# Patient Record
Sex: Male | Born: 2000
Health system: Southern US, Community
[De-identification: ages and names within clinical notes are randomized; demographics above are authoritative.]

## PROBLEM LIST (undated history)

## (undated) DIAGNOSIS — H5789 Other specified disorders of eye and adnexa: Secondary | ICD-10-CM

## (undated) HISTORY — DX: Other specified disorders of eye and adnexa: H57.89

---
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
## 2022-01-17 ENCOUNTER — Ambulatory Visit (INDEPENDENT_AMBULATORY_CARE_PROVIDER_SITE_OTHER): Payer: Medicaid Other | Admitting: Family Medicine

## 2022-01-17 ENCOUNTER — Encounter: Payer: Self-pay | Admitting: Family Medicine

## 2022-01-17 VITALS — BP 120/75 | HR 82 | Ht 68.9 in | Wt 195.2 lb

## 2022-01-17 DIAGNOSIS — R635 Abnormal weight gain: Secondary | ICD-10-CM

## 2022-01-17 DIAGNOSIS — R400 Somnolence: Secondary | ICD-10-CM | POA: Diagnosis not present

## 2022-01-17 DIAGNOSIS — T50905A Adverse effect of unspecified drugs, medicaments and biological substances, initial encounter: Secondary | ICD-10-CM | POA: Insufficient documentation

## 2022-01-17 DIAGNOSIS — F3164 Bipolar disorder, current episode mixed, severe, with psychotic features: Secondary | ICD-10-CM

## 2022-01-17 DIAGNOSIS — F312 Bipolar disorder, current episode manic severe with psychotic features: Secondary | ICD-10-CM

## 2022-01-17 NOTE — Assessment & Plan Note (Addendum)
I definitely think the weight gain is related to the olanzapine.  Recommend more regular exercise.  Talked with mom via phone, Damine and dad who are here in the office.  He had a lot of questions about diet.  I recommend just to healthy diet with no specific restriction on carbohydrates, no specific supplements etc.  I do not think this is related to any blood disorder.  He had extensive blood work in December and in January no sign of anemia or thyroid disorder.  There is no clinical exam signs that make me concerned for that either.  He does like to occasionally take trazodone at night which may potentially cause some weight gain but I think the bigger player is the olanzapine. ?

## 2022-01-17 NOTE — Patient Instructions (Signed)
I would recommend discussing your meds with psychiatry. I think the sleepiness and lack of energy and weight gain are partly related to his medicine. ?

## 2022-01-17 NOTE — Assessment & Plan Note (Signed)
Somnolence is most likely related to medication olanzapine.  It sounds like he has decreased significantly in his dose and the family and he were both hoping that the daytime sleepiness would go away.  Family was concerned something else such as anemia might be going on for thyroid issues.  Long discussion with Hayden [name] and with dad, I suspect olanzapine is still causing some somnolence.  I urged him to discuss with psychiatry.  Evidently they are going to try to taper him back to 5 mg.  I did warn him that even at 5 mg the side effects might not totally go away.  I recommended more regular exercise. ?

## 2022-01-17 NOTE — Progress Notes (Signed)
? ? ?  CHIEF COMPLAINT / HPI: ?Here with dad brother and his mom is on the phone.  Concerned about needing to sleep more than he or his family thinks he should.  Mom reports that he is always been one of the kids that needed more sleep than others.  Since he started the olanzapine, he has been very somnolent.  As they have decreased the dose, its gotten better.  Sometimes she has difficulty at night going to sleep and he takes trazodone which is prescribed by psychiatry.  He will need a least a nap or 2 during the day.  He does not feel very energetic and wants to do very little.  He does feel more settled in his mind than before starting the medication and his dad agrees with this saying he is "more lucid".  He has gained some weight but mom does not think he is following a very good diet.  Not getting regular exercise. ? ? ?PERTINENT  PMH / PSH: I have reviewed the patient?s medications, allergies, past medical and surgical history, smoking status and updated in the EMR as appropriate. ? ? ?OBJECTIVE: ? BP 120/75   Pulse 82   Ht 5' 8.9" (1.75 m)   Wt 195 lb 3.2 oz (88.5 kg)   SpO2 99%   BMI 28.91 kg/m?  ? ?GENERAL: Well-developed male no acute distress ?CV Pulses normal. ?MSK: Movement of extremity x4.  Can rise from a chair easily.  Normal gait. ?neuro: No gross focal neurologic logical deficits are noted. ?PSYCH: AxOx4. Good eye contact.. No psychomotor retardation or agitation. Appropriate speech fluency and content. Asks and answers questions appropriately. Mood is congruent. ? ? ?ASSESSMENT / PLAN: ? ? ?Somnolence ?Somnolence is most likely related to medication olanzapine.  It sounds like he has decreased significantly in his dose and the family and he were both hoping that the daytime sleepiness would go away.  Family was concerned something else such as anemia might be going on for thyroid issues.  Long discussion with Carmichael and with dad, I suspect olanzapine is still causing some somnolence.  I urged  him to discuss with psychiatry.  Evidently they are going to try to taper him back to 5 mg.  I did warn him that even at 5 mg the side effects might not totally go away.  I recommended more regular exercise. ? ?Weight gain ?I definitely think the weight gain is related to the olanzapine.  Recommend more regular exercise.  Talked with mom via phone, Thayden and dad who are here in the office.  He had a lot of questions about diet.  I recommend just to healthy diet with no specific restriction on carbohydrates, no specific supplements etc.  I do not think this is related to any blood disorder.  He had extensive blood work in December and in January no sign of anemia or thyroid disorder.  There is no clinical exam signs that make me concerned for that either.  He does like to occasionally take trazodone at night which may potentially cause some weight gain but I think the bigger player is the olanzapine. ? ?Bipolar I, most recent episode mixed, severe with psychotic behavior (HCC) ?He seems to be doing much better.  They have successfully tapered him down from higher dose Zyprexa/olanzapine.  I discontinued the Depakote and the propranolol.  Continue follow-up with psychiatry. ?  ?Denny Levy MD ?

## 2022-01-17 NOTE — Assessment & Plan Note (Signed)
He seems to be doing much better.  They have successfully tapered him down from higher dose Zyprexa/olanzapine.  I discontinued the Depakote and the propranolol.  Continue follow-up with psychiatry. ?

## 2022-02-13 ENCOUNTER — Encounter: Payer: Self-pay | Admitting: *Deleted

## 2023-06-27 IMAGING — CT CT HEAD W/O CM
3 series · 16 of 47 positions shown, 19 images · non-contrast
Comparison: None.

CLINICAL DATA: Altered mental status

EXAM:
CT HEAD WITHOUT CONTRAST
TECHNIQUE: Contiguous axial images were obtained from the base of the skull
through the vertex without intravenous contrast.

[Series 2: head wo · axial · 0.47mm/px · z∈[+1380,+1530]mm · 10 of 36 slices shown, 13 images]
[im 3/36  brain]
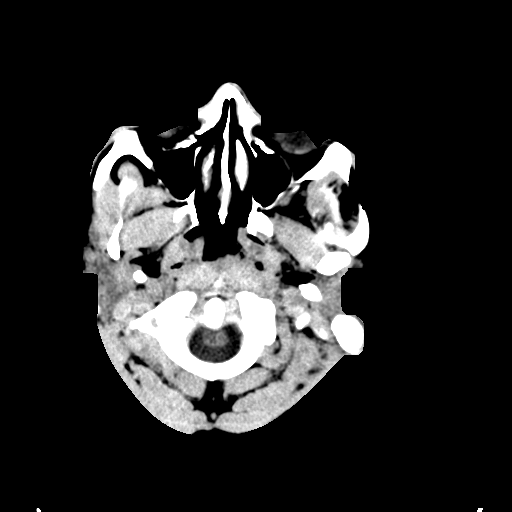
[im 3/36  bone]
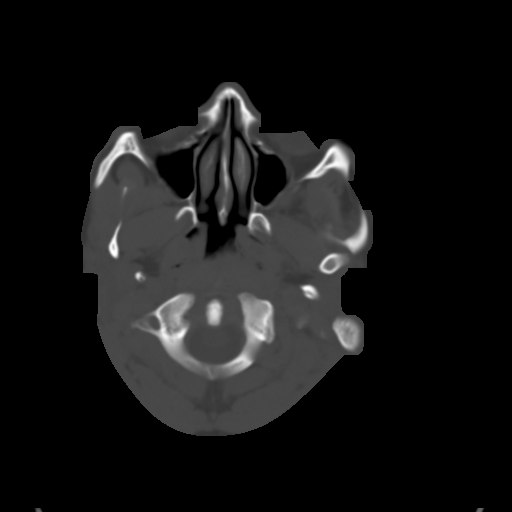
[im 7/36  brain]
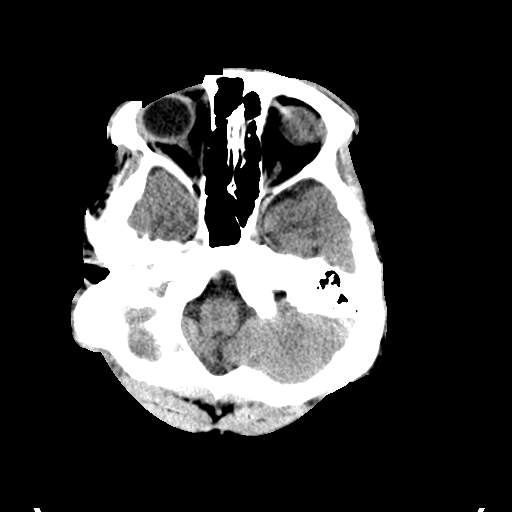
[im 10/36  brain]
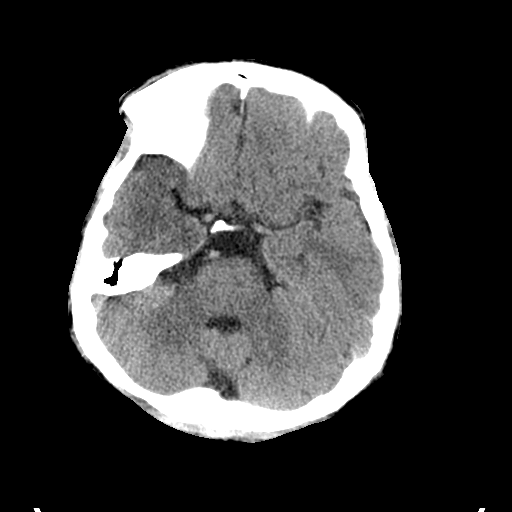
[im 13/36  brain]
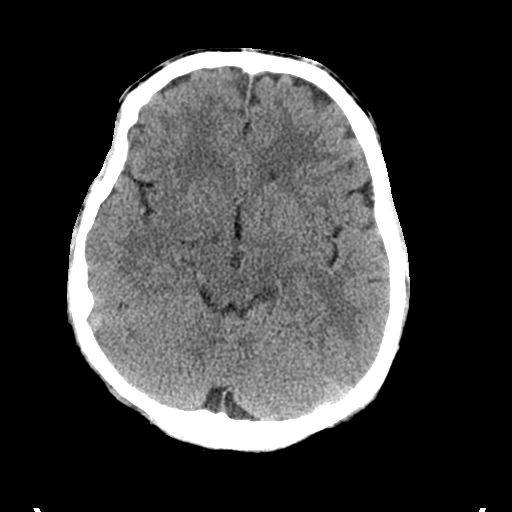
[im 16/36  brain]
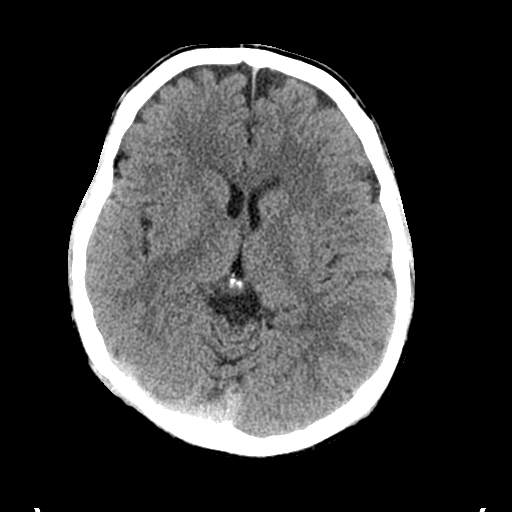
[im 16/36  bone]
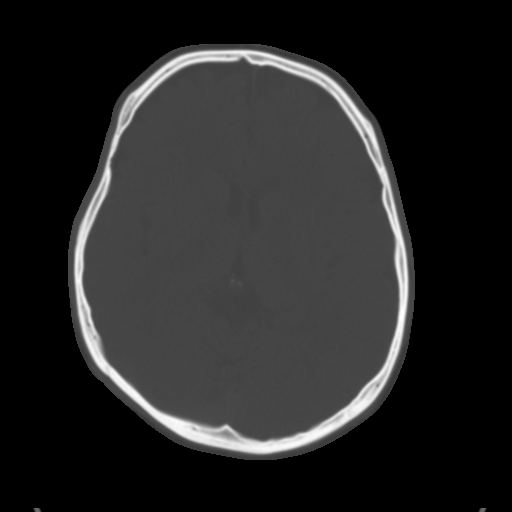
[im 20/36  brain]
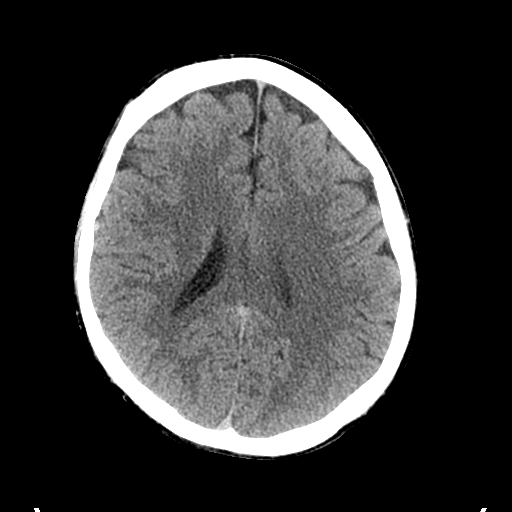
[im 23/36  brain]
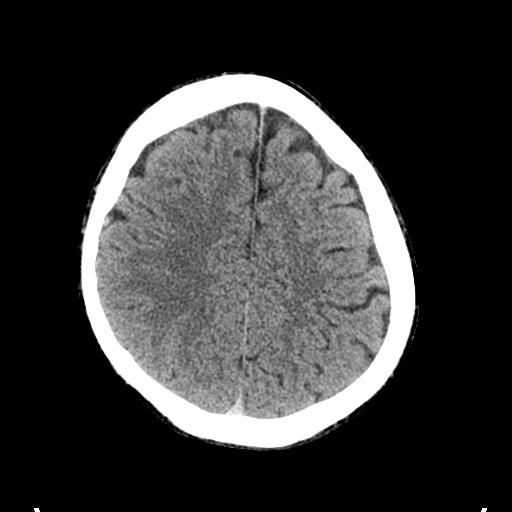
[im 27/36  brain]
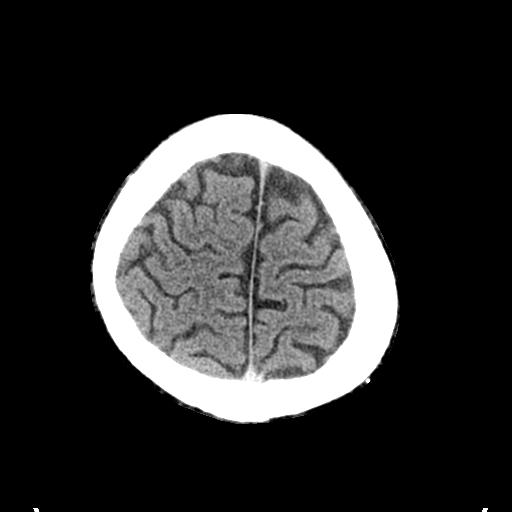
[im 29/36  brain]
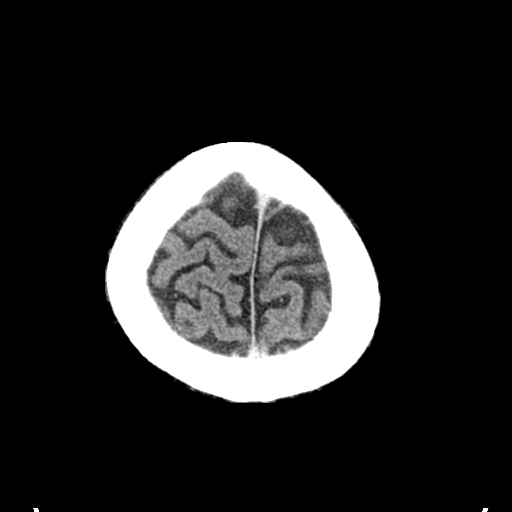
[im 29/36  bone]
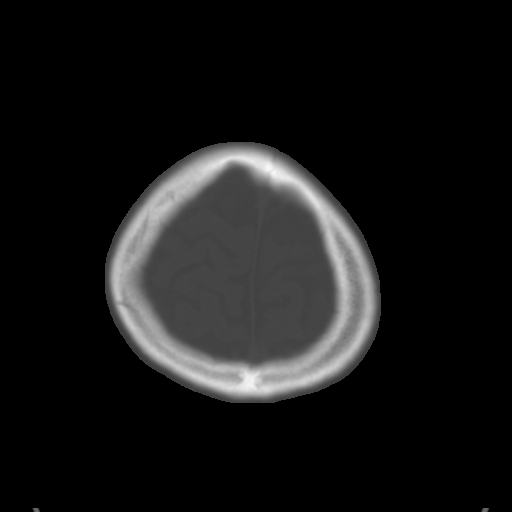
[im 33/36  brain]
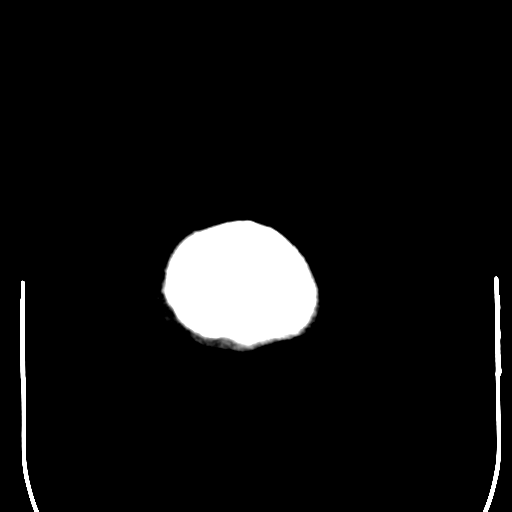

[Series 4: coronal soft tissue · coronal · 0.37mm/px · 3 of 73 slices shown]
[im 25/73  brain]
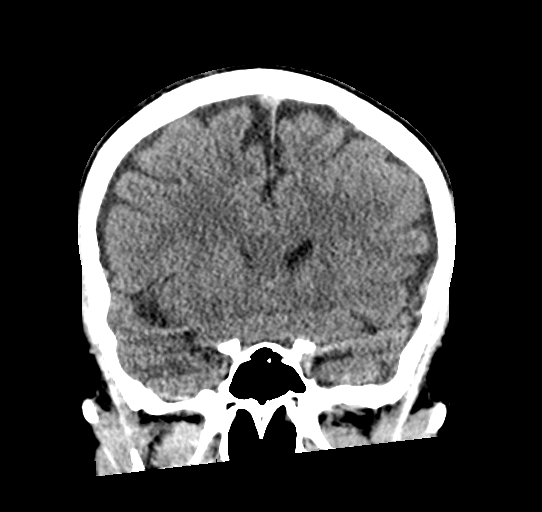
[im 33/73  brain]
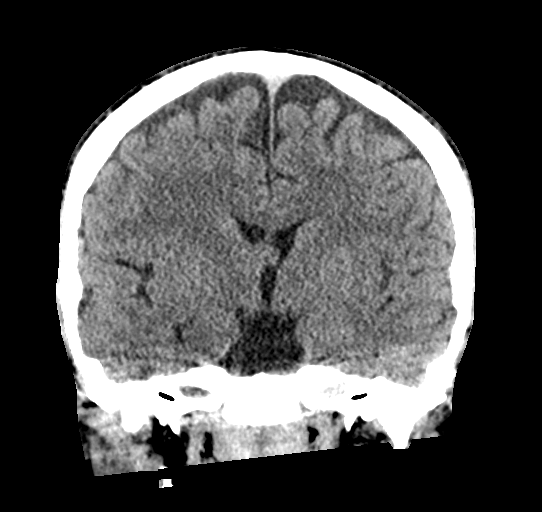
[im 41/73  brain]
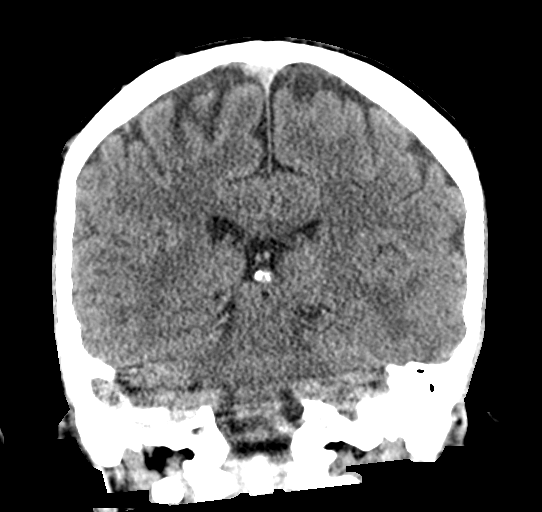

[Series 5: sagittal soft tissue · sagittal · 0.37mm/px · 3 of 67 slices shown]
[im 24/67  brain]
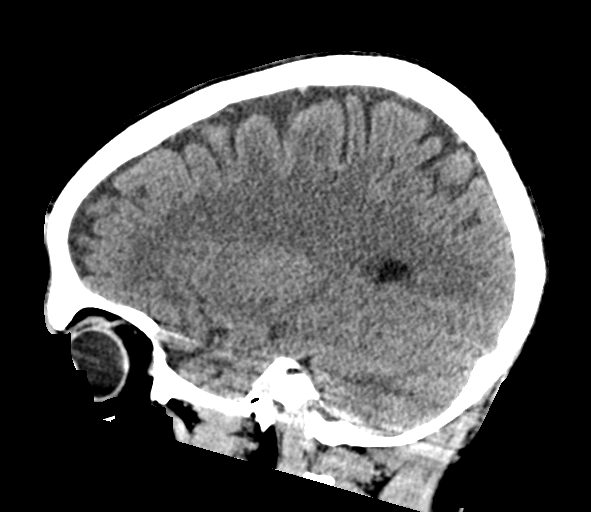
[im 34/67  brain]
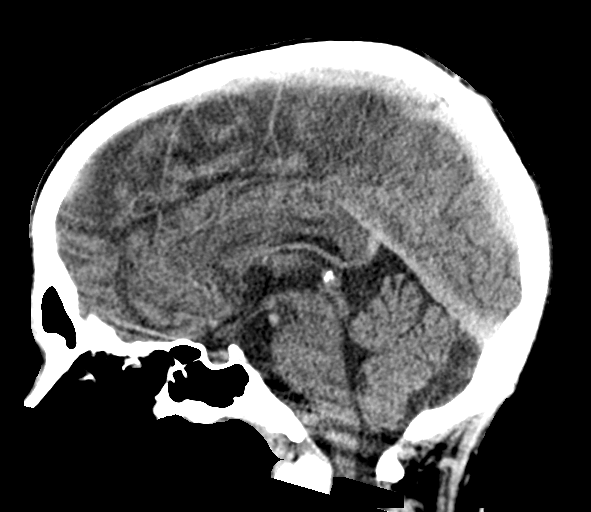
[im 43/67  brain]
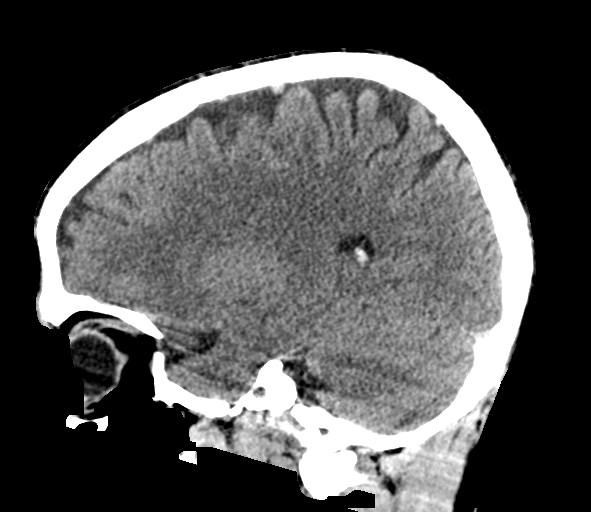

[16 of 47 positions shown; findings below may reference images not displayed]

FINDINGS: Brain: No acute intracranial hemorrhage, mass effect, or herniation.
No extra-axial fluid collections. No evidence of acute territorial
infarct. No hydrocephalus.

Vascular: No hyperdense vessel or unexpected calcification.

Skull: Normal. Negative for fracture or focal lesion.

Sinuses/Orbits: No acute finding.

Other: None.
IMPRESSION: No acute intracranial process identified.
